# Patient Record
Sex: Male | Born: 1985 | Race: White | Hispanic: No | Marital: Single | Smoking: Current every day smoker
Health system: Southern US, Community
[De-identification: ages and names within clinical notes are randomized; demographics above are authoritative.]

## PROBLEM LIST (undated history)

## (undated) DIAGNOSIS — I10 Essential (primary) hypertension: Secondary | ICD-10-CM

## (undated) HISTORY — DX: Essential (primary) hypertension: I10

---

## 2016-02-05 ENCOUNTER — Ambulatory Visit (INDEPENDENT_AMBULATORY_CARE_PROVIDER_SITE_OTHER): Payer: 59 | Admitting: Family Medicine

## 2016-02-05 VITALS — BP 122/84 | HR 91 | Temp 98.5°F | Resp 16 | Ht 72.0 in | Wt 238.4 lb

## 2016-02-05 DIAGNOSIS — I10 Essential (primary) hypertension: Secondary | ICD-10-CM

## 2016-02-05 DIAGNOSIS — Z23 Encounter for immunization: Secondary | ICD-10-CM | POA: Diagnosis not present

## 2016-02-05 MED ORDER — LISINOPRIL-HYDROCHLOROTHIAZIDE 20-12.5 MG PO TABS: 1.0000 | ORAL_TABLET | Freq: Every day | ORAL | 1 refills | Status: AC

## 2016-02-05 MED ORDER — POTASSIUM CHLORIDE ER 10 MEQ PO TBCR: 10.0000 meq | EXTENDED_RELEASE_TABLET | Freq: Every day | ORAL | 1 refills | Status: AC

## 2016-02-05 NOTE — Progress Notes (Signed)
Subjective:    Patient ID: Keith Maddox, male    DOB: 05-11-1986, 30 y.o.   MRN: [id number] By signing my name below, I, Javier Docker, attest that this documentation has been prepared under the direction and in the presence of Norberto Sorenson, MD. Electronically Signed: Javier Docker, ER Scribe. 02/05/2016. 11:09 AM.  Chief Complaint  Patient presents with  . Medication Refill    Lisinopril- HCTZ 20-12.5 and Potassium     HPI HPI Comments: Keith Maddox is a 30 y.o. male who presents to [facility] requesting a refill of his BP meds - lisinopril-hctz 20-12.5 with K 10. Has been on for years with good result.  He lives in [place] and follows up reg with his PCP there. Had labs done a few mos prior which he reports were normal.  He is in [place] for a temporary job and plans to return to [facility] in sev mos. No causes of secondary HTN identified, HTN does run in his family fairly strong.  Past Medical History:  Diagnosis Date  . Hypertension    No Known Allergies  No current outpatient prescriptions on file prior to visit.   No current facility-administered medications on file prior to visit.     Review of Systems  Constitutional: Negative for chills and fever.  Eyes: Negative for visual disturbance.  Respiratory: Negative for shortness of breath.   Cardiovascular: Negative for chest pain and leg swelling.  Neurological: Negative for dizziness, syncope, facial asymmetry, weakness, light-headedness and headaches.       Objective:   Physical Exam  Constitutional: He is oriented to person, place, and time. He appears well-developed and well-nourished. No distress.  HENT:  Head: Normocephalic and atraumatic.  Eyes: Pupils are equal, round, and reactive to light.  Neck: Neck supple.  Cardiovascular: Normal rate.   Pulmonary/Chest: Effort normal. No respiratory distress.  Musculoskeletal: Normal range of motion.  Neurological: He is alert and oriented to person, place, and time. Coordination  normal.  Skin: Skin is warm and dry. He is not diaphoretic.  Psychiatric: He has a normal mood and affect. His behavior is normal.  Nursing note and vitals reviewed.   BP 122/84 (BP Location: Right Arm, Patient Position: Sitting, Cuff Size: Large)   Pulse 91   Temp 98.5 F (36.9 C) (Oral)   Resp 16   Ht 6' (1.829 m)   Wt 238 lb 6.4 oz (108.1 kg)   SpO2 99%   BMI 32.33 kg/m      Assessment & Plan:   1. Essential hypertension   2. Need for prophylactic vaccination and inoculation against influenza   Meds refilled. Pt is in [place] for just a few mos for work. He reports he had normal lab work sev mos ago with his PCP in [facility] and will f/u again with PCP when he returns in to [facility] in sev mos.  Orders Placed This Encounter  Procedures  . Flu Vaccine QUAD 36+ mos IM    Meds ordered this encounter  Medications  . DISCONTD: lisinopril-hydrochlorothiazide (PRINZIDE,ZESTORETIC) 20-12.5 MG tablet    Sig: Take 1 tablet by mouth daily.  Marland Kitchen DISCONTD: potassium chloride (K-DUR) 10 MEQ tablet    Sig: Take 10 mEq by mouth daily.  Marland Kitchen lisinopril-hydrochlorothiazide (PRINZIDE,ZESTORETIC) 20-12.5 MG tablet    Sig: Take 1 tablet by mouth daily.    Dispense:  90 tablet    Refill:  1  . potassium chloride (K-DUR) 10 MEQ tablet    Sig: Take 1 tablet (  10 mEq total) by mouth daily.    Dispense:  90 tablet    Refill:  1    I personally performed the services described in this documentation, which was scribed in my presence. The recorded information has been reviewed and considered, and addended by me as needed.   Norberto SorensonEva Kess Mcilwain, M.D.  [facility]  [place] [address] [place], [place] [address] [phone] phone [phone] fax  02/20/16 12:46 AM

## 2016-02-05 NOTE — Patient Instructions (Addendum)
   IF you received an x-ray today, you will receive an invoice from [facility]. Please contact [facility] at [phone] with questions or concerns regarding your invoice.   IF you received labwork today, you will receive an invoice from [facility]. Please contact [facility] at [phone] with questions or concerns regarding your invoice.   Our billing staff will not be able to assist you with questions regarding bills from these companies.  You will be contacted with the lab results as soon as they are available. The fastest way to get your results is to activate your My Chart account. Instructions are located on the last page of this paperwork. If you have not heard from us regarding the results in 2 weeks, please contact this office.    Hypertension Hypertension, commonly called high blood pressure, is when the force of blood pumping through your arteries is too strong. Your arteries are the blood vessels that carry blood from your heart throughout your body. A blood pressure reading consists of a higher number over a lower number, such as 110/72. The higher number (systolic) is the pressure inside your arteries when your heart pumps. The lower number (diastolic) is the pressure inside your arteries when your heart relaxes. Ideally you want your blood pressure below 120/80. Hypertension forces your heart to work harder to pump blood. Your arteries may become narrow or stiff. Having untreated or uncontrolled hypertension can cause heart attack, stroke, kidney disease, and other problems. RISK FACTORS Some risk factors for high blood pressure are controllable. Others are not.  Risk factors you cannot control include:   Race. You may be at higher risk if you are African American.  Age. Risk increases with age.  Gender. Men are at higher risk than women before age 45 years. After age 65, women are at higher risk than men. Risk factors you can  control include:  Not getting enough exercise or physical activity.  Being overweight.  Getting too much fat, sugar, calories, or salt in your diet.  Drinking too much alcohol. SIGNS AND SYMPTOMS Hypertension does not usually cause signs or symptoms. Extremely high blood pressure (hypertensive crisis) may cause headache, anxiety, shortness of breath, and nosebleed. DIAGNOSIS To check if you have hypertension, your health care provider will measure your blood pressure while you are seated, with your arm held at the level of your heart. It should be measured at least twice using the same arm. Certain conditions can cause a difference in blood pressure between your right and left arms. A blood pressure reading that is higher than normal on one occasion does not mean that you need treatment. If it is not clear whether you have high blood pressure, you may be asked to return on a different day to have your blood pressure checked again. Or, you may be asked to monitor your blood pressure at home for 1 or more weeks. TREATMENT Treating high blood pressure includes making lifestyle changes and possibly taking medicine. Living a healthy lifestyle can help lower high blood pressure. You may need to change some of your habits. Lifestyle changes may include:  Following the DASH diet. This diet is high in fruits, vegetables, and whole grains. It is low in salt, red meat, and added sugars.  Keep your sodium intake below 2,300 mg per day.  Getting at least 30-45 minutes of aerobic exercise at least 4 times per week.  Losing weight if necessary.  Not smoking.  Limiting alcoholic beverages.    Learning ways to reduce stress. Your health care provider may prescribe medicine if lifestyle changes are not enough to get your blood pressure under control, and if one of the following is true:  You are 18-59 years of age and your systolic blood pressure is above 140.  You are 60 years of age or older, and  your systolic blood pressure is above 150.  Your diastolic blood pressure is above 90.  You have diabetes, and your systolic blood pressure is over 140 or your diastolic blood pressure is over 90.  You have kidney disease and your blood pressure is above 140/90.  You have heart disease and your blood pressure is above 140/90. Your personal target blood pressure may vary depending on your medical conditions, your age, and other factors. HOME CARE INSTRUCTIONS  Have your blood pressure rechecked as directed by your health care provider.   Take medicines only as directed by your health care provider. Follow the directions carefully. Blood pressure medicines must be taken as prescribed. The medicine does not work as well when you skip doses. Skipping doses also puts you at risk for problems.  Do not smoke.   Monitor your blood pressure at home as directed by your health care provider. SEEK MEDICAL CARE IF:   You think you are having a reaction to medicines taken.  You have recurrent headaches or feel dizzy.  You have swelling in your ankles.  You have trouble with your vision. SEEK IMMEDIATE MEDICAL CARE IF:  You develop a severe headache or confusion.  You have unusual weakness, numbness, or feel faint.  You have severe chest or abdominal pain.  You vomit repeatedly.  You have trouble breathing. MAKE SURE YOU:   Understand these instructions.  Will watch your condition.  Will get help right away if you are not doing well or get worse.   This information is not intended to replace advice given to you by your health care provider. Make sure you discuss any questions you have with your health care provider.   Document Released: 05/08/2005 Document Revised: 09/22/2014 Document Reviewed: 02/28/2013 Elsevier Interactive Patient Education 2016 Elsevier Inc.  Managing Your High Blood Pressure Blood pressure is a measurement of how forceful your blood is pressing against  the walls of the arteries. Arteries are muscular tubes within the circulatory system. Blood pressure does not stay the same. Blood pressure rises when you are active, excited, or nervous; and it lowers during sleep and relaxation. If the numbers measuring your blood pressure stay above normal most of the time, you are at risk for health problems. High blood pressure (hypertension) is a long-term (chronic) condition in which blood pressure is elevated. A blood pressure reading is recorded as two numbers, such as 120 over 80 (or 120/80). The first, higher number is called the systolic pressure. It is a measure of the pressure in your arteries as the heart beats. The second, lower number is called the diastolic pressure. It is a measure of the pressure in your arteries as the heart relaxes between beats.  Keeping your blood pressure in a normal range is important to your overall health and prevention of health problems, such as heart disease and stroke. When your blood pressure is uncontrolled, your heart has to work harder than normal. High blood pressure is a very common condition in adults because blood pressure tends to rise with age. Men and women are equally likely to have hypertension but at different times in life. Before age   45, men are more likely to have hypertension. After 28 years of age, women are more likely to have it. Hypertension is especially common in African Americans. This condition often has no signs or symptoms. The cause of the condition is usually not known. Your caregiver can help you come up with a plan to keep your blood pressure in a normal, healthy range. BLOOD PRESSURE STAGES Blood pressure is classified into four stages: normal, prehypertension, stage 1, and stage 2. Your blood pressure reading will be used to determine what type of treatment, if any, is necessary. Appropriate treatment options are tied to these four stages:  Normal  Systolic pressure (mm Hg): below  120.  Diastolic pressure (mm Hg): below 80. Prehypertension  Systolic pressure (mm Hg): 120 to 139.  Diastolic pressure (mm Hg): 80 to 89. Stage1  Systolic pressure (mm Hg): 140 to 159.  Diastolic pressure (mm Hg): 90 to 99. Stage2  Systolic pressure (mm Hg): 160 or above.  Diastolic pressure (mm Hg): 100 or above. RISKS RELATED TO HIGH BLOOD PRESSURE Managing your blood pressure is an important responsibility. Uncontrolled high blood pressure can lead to:  A heart attack.  A stroke.  A weakened blood vessel (aneurysm).  Heart failure.  Kidney damage.  Eye damage.  Metabolic syndrome.  Memory and concentration problems. HOW TO MANAGE YOUR BLOOD PRESSURE Blood pressure can be managed effectively with lifestyle changes and medicines (if needed). Your caregiver will help you come up with a plan to bring your blood pressure within a normal range. Your plan should include the following: Education  Read all information provided by your caregivers about how to control blood pressure.  Educate yourself on the latest guidelines and treatment recommendations. New research is always being done to further define the risks and treatments for high blood pressure. Lifestylechanges  Control your weight.  Avoid smoking.  Stay physically active.  Reduce the amount of salt in your diet.  Reduce stress.  Control any chronic conditions, such as high cholesterol or diabetes.  Reduce your alcohol intake. Medicines  Several medicines (antihypertensive medicines) are available, if needed, to bring blood pressure within a normal range. Communication  Review all the medicines you take with your caregiver because there may be side effects or interactions.  Talk with your caregiver about your diet, exercise habits, and other lifestyle factors that may be contributing to high blood pressure.  See your caregiver regularly. Your caregiver can help you create and adjust your plan  for managing high blood pressure. RECOMMENDATIONS FOR TREATMENT AND FOLLOW-UP  The following recommendations are based on current guidelines for managing high blood pressure in nonpregnant adults. Use these recommendations to identify the proper follow-up period or treatment option based on your blood pressure reading. You can discuss these options with your caregiver.  Systolic pressure of 120 to 139 or diastolic pressure of 80 to 89: Follow up with your caregiver as directed.  Systolic pressure of 140 to 160 or diastolic pressure of 90 to 100: Follow up with your caregiver within 2 months.  Systolic pressure above 160 or diastolic pressure above 100: Follow up with your caregiver within 1 month.  Systolic pressure above 180 or diastolic pressure above 110: Consider antihypertensive therapy; follow up with your caregiver within 1 week.  Systolic pressure above 200 or diastolic pressure above 120: Begin antihypertensive therapy; follow up with your caregiver within 1 week.   This information is not intended to replace advice given to you by your health care   provider. Make sure you discuss any questions you have with your health care provider.   Document Released: 01/31/2012 Document Reviewed: 01/31/2012 Elsevier Interactive Patient Education 2016 Elsevier Inc.  

## 2016-02-15 ENCOUNTER — Ambulatory Visit (HOSPITAL_COMMUNITY)
Admission: RE | Admit: 2016-02-15 | Discharge: 2016-02-15 | Disposition: A | Discharge status: Home / Self Care | Payer: Medicaid Other | Source: Ambulatory Visit | Attending: Physician Assistant | Admitting: Physician Assistant

## 2016-02-15 ENCOUNTER — Ambulatory Visit (INDEPENDENT_AMBULATORY_CARE_PROVIDER_SITE_OTHER): Payer: 59

## 2016-02-15 ENCOUNTER — Ambulatory Visit (INDEPENDENT_AMBULATORY_CARE_PROVIDER_SITE_OTHER): Payer: 59 | Admitting: Physician Assistant

## 2016-02-15 ENCOUNTER — Encounter (HOSPITAL_COMMUNITY): Payer: Self-pay

## 2016-02-15 VITALS — BP 108/68 | HR 88 | Temp 98.6°F | Resp 18 | Ht 72.0 in | Wt 233.0 lb

## 2016-02-15 DIAGNOSIS — R609 Edema, unspecified: Secondary | ICD-10-CM | POA: Diagnosis not present

## 2016-02-15 DIAGNOSIS — R935 Abnormal findings on diagnostic imaging of other abdominal regions, including retroperitoneum: Secondary | ICD-10-CM | POA: Diagnosis not present

## 2016-02-15 DIAGNOSIS — R630 Anorexia: Secondary | ICD-10-CM

## 2016-02-15 DIAGNOSIS — R1032 Left lower quadrant pain: Secondary | ICD-10-CM

## 2016-02-15 DIAGNOSIS — G43A0 Cyclical vomiting, not intractable: Secondary | ICD-10-CM | POA: Diagnosis not present

## 2016-02-15 LAB — COMPLETE METABOLIC PANEL WITH GFR
ALT: 18 U/L (ref 9–46)
AST: 12 U/L (ref 10–40)
Albumin: 4.2 g/dL (ref 3.6–5.1)
Alkaline Phosphatase: 38 U/L — ABNORMAL LOW (ref 40–115)
BILIRUBIN TOTAL: 0.3 mg/dL (ref 0.2–1.2)
BUN: 8 mg/dL (ref 7–25)
CHLORIDE: 103 mmol/L (ref 98–110)
CO2: 22 mmol/L (ref 20–31)
CREATININE: 1.01 mg/dL (ref 0.60–1.35)
Calcium: 8.9 mg/dL (ref 8.6–10.3)
GFR, Est Non African American: >89 mL/min (ref >=60)
GLUCOSE: 94 mg/dL (ref 65–99)
Potassium: 3.4 mmol/L — ABNORMAL LOW (ref 3.5–5.3)
SODIUM: 137 mmol/L (ref 135–146)
Total Protein: 6.9 g/dL (ref 6.1–8.1)

## 2016-02-15 LAB — POCT CBC
Granulocyte percent: 79.5 %G (ref 37–80)
HCT, POC: 42.9 % — AB (ref 43.5–53.7)
HEMOGLOBIN: 15.4 g/dL (ref 14.1–18.1)
LYMPH, POC: 1.3 (ref 0.6–3.4)
MCH, POC: 31.8 pg — AB (ref 27–31.2)
MCHC: 35.8 g/dL — AB (ref 31.8–35.4)
MCV: 88.7 fL (ref 80–97)
MID (cbc): 0.5 (ref 0–0.9)
MPV: 8.6 fL (ref 0–99.8)
PLATELET COUNT, POC: 209 10*3/uL (ref 142–424)
POC GRANULOCYTE: 7.1 — AB (ref 2–6.9)
POC LYMPH %: 14.4 % (ref 10–50)
POC MID %: 6.1 %M (ref 0–12)
RBC: 4.84 M/uL (ref 4.69–6.13)
RDW, POC: 13.5 %
WBC: 8.9 10*3/uL (ref 4.6–10.2)

## 2016-02-15 LAB — POCT URINALYSIS DIP (MANUAL ENTRY)
BILIRUBIN UA: NEGATIVE
Bilirubin, UA: NEGATIVE
Blood, UA: NEGATIVE
Glucose, UA: NEGATIVE
LEUKOCYTES UA: NEGATIVE
NITRITE UA: NEGATIVE
PH UA: 7
PROTEIN UA: NEGATIVE
Spec Grav, UA: 1.015
UROBILINOGEN UA: 0.2

## 2016-02-15 LAB — LIPASE: Lipase: 10 U/L (ref 7–60)

## 2016-02-15 LAB — POCT SEDIMENTATION RATE: POCT SED RATE: 31 mm/h — AB (ref 0–22)

## 2016-02-15 LAB — PSA: PSA: 0.3 ng/mL (ref <=4.0)

## 2016-02-15 MED ORDER — ONDANSETRON 4 MG PO TBDP
4.0000 mg | ORAL_TABLET | Freq: Once | ORAL | Status: AC
  Administered 2016-02-15: 4 mg via ORAL

## 2016-02-15 MED ORDER — IOPAMIDOL (ISOVUE-300) INJECTION 61%
INTRAVENOUS | Status: AC
Start: 2016-02-15 — End: 2016-02-15
  Administered 2016-02-15: 100 mL
  Filled 2016-02-15: qty 30

## 2016-02-15 NOTE — Addendum Note (Signed)
Addended by: Nonnie DoneSMITH, JILL D on: 02/15/2016 04:20 PM   Modules accepted: Orders

## 2016-02-15 NOTE — Progress Notes (Signed)
02/15/2016 4:14 PM   DOB: 1985/10/04 / MRN: [id number]  SUBJECTIVE:  Keith Maddox is a 30 y.o. male presenting for right lower abdominal quadrant pain that started yesterday. He associates emesis and nausea with the pain.  States this started all of the sudden.  He has hand one non bloody bowel movement since.  He does not drink.  He does smoke at 1 pack/day for the last 8 years.  No fever.  Works as a Psychologist, occupationalwelder. Can not eat or drink due to the nausea.   He has No Known Allergies.   He  has a past medical history of Hypertension.    He  reports that he has been smoking.  He has never used smokeless tobacco. He  has no sexual activity history on file. The patient  has no past surgical history on file.  His family history is not on file.  Review of Systems  Constitutional: Negative for chills and fever.  Respiratory: Negative for cough.   Cardiovascular: Negative for chest pain.  Gastrointestinal: Positive for abdominal pain, nausea and vomiting. Negative for blood in stool, constipation, diarrhea, heartburn and melena.  Genitourinary: Negative for dysuria.  Skin: Negative for itching and rash.  Neurological: Negative for dizziness.    The problem list and medications were reviewed and updated by myself where necessary and exist elsewhere in the encounter.   OBJECTIVE:  BP 108/68 (BP Location: Right Arm, Patient Position: Sitting, Cuff Size: Large)   Pulse 88   Temp 98.6 F (37 C) (Oral)   Resp 18   Ht 6' (1.829 m)   Wt 233 lb (105.7 kg)   SpO2 98%   BMI 31.60 kg/m   Physical Exam  Constitutional: He is oriented to person, place, and time. Vital signs are normal. He appears ill. No distress.  Cardiovascular: Normal rate and regular rhythm.   Pulmonary/Chest: Effort normal and breath sounds normal.  Abdominal: Soft. Bowel sounds are normal. There is tenderness (RLQ, LLQ, positive Rosving, negative obturator. ).  Musculoskeletal: Normal range of motion.  Neurological: He is  alert and oriented to person, place, and time. He displays normal reflexes. No cranial nerve deficit. He exhibits normal muscle tone. Coordination normal.  Skin: Skin is warm and dry.  Psychiatric: He has a normal mood and affect.    Results for orders placed or performed in visit on 02/15/16 (from the past 72 hour(s))  POCT CBC     Status: Abnormal   Collection Time: 02/15/16  3:11 PM  Result Value Ref Range   WBC 8.9 4.6 - 10.2 K/uL   Lymph, poc 1.3 0.6 - 3.4   POC LYMPH PERCENT 14.4 10 - 50 %L   MID (cbc) 0.5 0 - 0.9   POC MID % 6.1 0 - 12 %M   POC Granulocyte 7.1 (A) 2 - 6.9   Granulocyte percent 79.5 37 - 80 %G   RBC 4.84 4.69 - 6.13 M/uL   Hemoglobin 15.4 14.1 - 18.1 g/dL   HCT, POC 40.942.9 (A) 81.143.5 - 53.7 %   MCV 88.7 80 - 97 fL   MCH, POC 31.8 (A) 27 - 31.2 pg   MCHC 35.8 (A) 31.8 - 35.4 g/dL   RDW, POC 91.413.5 %   Platelet Count, POC 209 142 - 424 K/uL   MPV 8.6 0 - 99.8 fL  POCT urinalysis dipstick     Status: None   Collection Time: 02/15/16  3:56 PM  Result Value Ref Range   Color,  UA yellow yellow   Clarity, UA clear clear   Glucose, UA negative negative   Bilirubin, UA negative negative   Ketones, POC UA negative negative   Spec Grav, UA 1.015    Blood, UA negative negative   pH, UA 7.0    Protein Ur, POC negative negative   Urobilinogen, UA 0.2    Nitrite, UA Negative Negative   Leukocytes, UA Negative Negative  POCT SEDIMENTATION RATE     Status: Abnormal   Collection Time: 02/15/16  4:03 PM  Result Value Ref Range   POCT SED RATE 31 (A) 0 - 22 mm/hr    Dg Abd 2 Views  Result Date: 02/15/2016 CLINICAL DATA:  Left lower quadrant abdominal pain and tenderness with nausea and vomiting. EXAM: ABDOMEN - 2 VIEW COMPARISON:  None. FINDINGS: The bowel gas pattern is nonobstructive. Prominent stool burden throughout the colon is noted. No free intraperitoneal air. No abnormal abdominal calcification or bony abnormality. IMPRESSION: No acute abnormality. Prominent  stool burden throughout the colon. Electronically Signed   By: Drusilla Kanner M.D.   On: 02/15/2016 15:02    ASSESSMENT AND PLAN  Olin was seen today for abdominal pain, nausea, emesis and generalized body aches.  Diagnoses and all orders for this visit:  LLQ abdominal pain: His work up is reassuring however I have a strong clinical suspicion for appendicitis.  Will get a CT of a the abdomen and pelvis tonight.   -     POCT CBC -     COMPLETE METABOLIC PANEL WITH GFR -     Lipase -     POCT SEDIMENTATION RATE -     DG Abd 2 Views; Future -     PSA -     CT Abdomen Pelvis W Contrast; Future  Anorexia -     POCT urinalysis dipstick  Cyclical vomiting with nausea, intractability of vomiting not specified -     ondansetron (ZOFRAN-ODT) disintegrating tablet 4 mg; Take 1 tablet (4 mg total) by mouth once.    The patient is advised to call or return to clinic if he does not see an improvement in symptoms, or to seek the care of the closest emergency department if he worsens with the above plan.   Deliah Boston, MHS, PA-C [facility] 02/15/2016 4:14 PM

## 2016-02-15 NOTE — Patient Instructions (Signed)
     IF you received an x-ray today, you will receive an invoice from [facility]. Please contact [facility] at [phone] with questions or concerns regarding your invoice.   IF you received labwork today, you will receive an invoice from [facility]. Please contact [facility] at [phone] with questions or concerns regarding your invoice.   Our billing staff will not be able to assist you with questions regarding bills from these companies.  You will be contacted with the lab results as soon as they are available. The fastest way to get your results is to activate your My Chart account. Instructions are located on the last page of this paperwork. If you have not heard from us regarding the results in 2 weeks, please contact this office.      

## 2016-02-16 ENCOUNTER — Encounter: Payer: Self-pay | Admitting: *Deleted

## 2016-02-16 ENCOUNTER — Telehealth: Payer: Self-pay

## 2016-02-16 ENCOUNTER — Ambulatory Visit: Payer: 59

## 2016-02-16 NOTE — Progress Notes (Signed)
Labs overall unremarkable.  Given his CT findings and the fact that these symptoms tend to wax and wane he may have inflammatory bowel disease. I am referring him to ID. Deliah BostonMichael Jnyah Brazee, MS, PA-C 1:11 PM, 02/16/2016

## 2016-02-16 NOTE — Telephone Encounter (Signed)
Auth for CT preformed 9/26 is pending clinical information. Please call and supply necessary info to complete the claim.  Case *[id number]  [phone]

## 2016-02-16 NOTE — Telephone Encounter (Signed)
Please f/u

## 2016-02-16 NOTE — Telephone Encounter (Signed)
He saw Keith Maddox on 02/15/16 for LLQ abdominal pain +2 more.  We do not accept his insurance-[employer]-Community Plan-Medicaid. Keith Maddox would like a referral to see a specialist for his stomach issues. He is in extreme pain concerning his stomach. The Tramadol he was prescribed is not working, he would like another pain med. Please advise at [phone]

## 2016-02-17 ENCOUNTER — Telehealth: Payer: Self-pay

## 2016-02-17 ENCOUNTER — Other Ambulatory Visit: Payer: Self-pay | Admitting: Physician Assistant

## 2016-02-17 ENCOUNTER — Telehealth: Payer: Self-pay | Admitting: Emergency Medicine

## 2016-02-17 DIAGNOSIS — K639 Disease of intestine, unspecified: Secondary | ICD-10-CM

## 2016-02-17 MED ORDER — HYDROCODONE-ACETAMINOPHEN 5-325 MG PO TABS: 1.0000 | ORAL_TABLET | Freq: Four times a day (QID) | ORAL | 0 refills | Status: AC | PRN

## 2016-02-17 NOTE — Telephone Encounter (Signed)
Pt is coming in to get his pain meds script but is hoping that michael could get a referral to a gastro   Best number [phone]

## 2016-02-17 NOTE — Telephone Encounter (Signed)
I do not see a referral on his chart other than for the CT scan. Please resubmit so that I can get him sent out to a specialist. Thank you!

## 2016-02-17 NOTE — Telephone Encounter (Signed)
This is in!

## 2016-02-17 NOTE — Telephone Encounter (Signed)
Referal to specialist is in.  I am starting his on Norco 5-325 but I can not keep him on this for more than 5 days total.  After we will have to go back to tramadol.  Deliah BostonMichael Jovoni Borkenhagen, MS, PA-C 8:25 AM, 02/17/2016

## 2016-02-17 NOTE — Telephone Encounter (Signed)
Left message to return call regarding pain medication   Hydrocodone ordered and placed at front for pick up

## 2016-02-18 ENCOUNTER — Telehealth: Payer: Self-pay | Admitting: Emergency Medicine

## 2016-02-18 NOTE — Telephone Encounter (Signed)
Left message that GI referral was placed yesterstay

## 2016-02-28 NOTE — Telephone Encounter (Signed)
The scan was preformed on 9/26. Rerouting to clinical to have someone call the insurance company in attempts to gain retro-certification.

## 2016-02-28 NOTE — Telephone Encounter (Signed)
Hi Jill. Can we have someone on the RN side call. Please see my note in which I clearly state concern for appendicitis in the assess plan.  His workup was okay in the office but his exam and overall appearance showed he needed a scan.

## 2016-02-28 NOTE — Telephone Encounter (Signed)
Contacted pt because it looks like he had an appt w/GI we referred to on 10/2. Pt stated that he did go to the GI and it was not appendicitis (he no longer needs the scan). He had a colonoscopy on Thurs and they are 95% sure that he has Crohn's disease, but he has further f/up or testing to verify Dx. Pt wanted to thank Casimiro NeedleMichael for all of his help.  Referrals, you can cancel the CT scan referral. Thanks!

## 2016-02-28 NOTE — Telephone Encounter (Signed)
CT scan has already taken place and was approved prior auth as we did it stat.    Sound like ins company is trying to deny after it was approved despite the concern for acute appendicitis expressed in my note.  Regardless I am glad he received the scan as it sounds like it lead to a meaningful referral and important diagnosis. Deliah BostonMichael Debar Plate, MS, PA-C 7:36 PM, 02/28/2016

## 2016-02-28 NOTE — Telephone Encounter (Signed)
This message was sent to clinical. Wanted to forward it to you as well since you were the ordering provider. If you have time to call, it would be greatly appreciated as the ins is currently denying coverage. Thank you!

## 2016-03-02 ENCOUNTER — Telehealth: Payer: Self-pay

## 2016-03-02 NOTE — Telephone Encounter (Signed)
Pt was under the impression that there should be a dr's note printed for him. Could not find it in the pick up drawer at front desk or attached to his chart or avs anywhere.   Once completed, please mail out to   Attn Roselie SkinnerNicholas Grill [facility] [address] [place] [id number]  Thank you!

## 2016-03-03 NOTE — Telephone Encounter (Signed)
Note printed and mailed. 

## 2016-03-24 ENCOUNTER — Ambulatory Visit (INDEPENDENT_AMBULATORY_CARE_PROVIDER_SITE_OTHER): Payer: 59 | Admitting: Physician Assistant

## 2016-03-24 VITALS — BP 128/80 | HR 97 | Temp 98.2°F | Resp 16 | Ht 72.0 in | Wt 231.0 lb

## 2016-03-24 DIAGNOSIS — K120 Recurrent oral aphthae: Secondary | ICD-10-CM

## 2016-03-24 DIAGNOSIS — R42 Dizziness and giddiness: Secondary | ICD-10-CM

## 2016-03-24 DIAGNOSIS — R110 Nausea: Secondary | ICD-10-CM

## 2016-03-24 DIAGNOSIS — Z8719 Personal history of other diseases of the digestive system: Secondary | ICD-10-CM | POA: Diagnosis not present

## 2016-03-24 LAB — COMPLETE METABOLIC PANEL WITH GFR
ALT: 21 U/L (ref 9–46)
Albumin: 4.8 g/dL (ref 3.6–5.1)
Alkaline Phosphatase: 42 U/L (ref 40–115)
CO2: 23 mmol/L (ref 20–31)
Calcium: 10 mg/dL (ref 8.6–10.3)
Creat: 0.95 mg/dL (ref 0.60–1.35)
GFR, Est African American: >89 mL/min (ref >=60)
Total Bilirubin: 0.5 mg/dL (ref 0.2–1.2)
Total Protein: 7.9 g/dL (ref 6.1–8.1)

## 2016-03-24 LAB — POCT CBC
Granulocyte percent: 83.9 %G — AB (ref 37–80)
HCT, POC: 46.4 % (ref 43.5–53.7)
Hemoglobin: 16.3 g/dL (ref 14.1–18.1)
Lymph, poc: 2.7 (ref 0.6–3.4)
MCH, POC: 31.8 pg — AB (ref 27–31.2)
MCHC: 35.1 g/dL (ref 31.8–35.4)
MCV: 90.5 fL (ref 80–97)
MID (cbc): 0.2 (ref 0–0.9)
MPV: 7.7 fL (ref 0–99.8)
POC Granulocyte: 15.1 — AB (ref 2–6.9)
POC LYMPH PERCENT: 15.2 %L (ref 10–50)
POC MID %: 0.9 % (ref 0–12)
Platelet Count, POC: 306 10*3/uL (ref 142–424)
RBC: 5.12 M/uL (ref 4.69–6.13)
RDW, POC: 14.5 %
WBC: 18 10*3/uL — AB (ref 4.6–10.2)

## 2016-03-24 LAB — COMPLETE METABOLIC PANEL WITHOUT GFR
AST: 14 U/L (ref 10–40)
BUN: 17 mg/dL (ref 7–25)
Chloride: 96 mmol/L — ABNORMAL LOW (ref 98–110)
GFR, Est Non African American: >89 mL/min (ref >=60)
Glucose, Bld: 121 mg/dL — ABNORMAL HIGH (ref 65–99)
Potassium: 4 mmol/L (ref 3.5–5.3)
Sodium: 134 mmol/L — ABNORMAL LOW (ref 135–146)

## 2016-03-24 LAB — TSH: TSH: 0.77 mIU/L (ref 0.40–4.50)

## 2016-03-24 NOTE — Progress Notes (Signed)
Keith Maddox  MRN: [id number] DOB: [birth year]  PCP: No PCP Per Patient  Subjective:  Pt is a 30 year old male, history of hypertension and recent diagnosis of Chron's disease, presents to clinic for dizziness x several days.   Five days ago he felt fatigued and left work early. The following day he reported for work, however was brought to tears because felt so badly. Complains of light-headedness, dizziness, "feeling weird" and "pukey". Notes associated chills followed by episodes of sweating.    Denies syncope, however felt like he could pass out. Denies blood in stool, fevers, vomiting, diarrhea, abnormal stools, abdominal pain.  Reports congestion last week.  Is currently on steroid treatment for recent diagnosis of Chron's disease. He has a follow-up appointment in two weeks with GI.  Takes Norco for abdominal pain.   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 Works at [employer]. Has not gone to work the past two days   Was here two months ago for abdominal pain. Abdominal CT 9/26 revealed abnormal wall edema involving terminal ileum and ascending colon. Findings are compatible with inflammatory or infectious enteritis/colitis.  [facility] - [facility] specialist.   Review of Systems  Constitutional: Positive for activity change, chills and fever. Negative for appetite change.  HENT: Positive for congestion and mouth sores.   Respiratory: Negative for cough, chest tightness, shortness of breath and wheezing.   Cardiovascular: Negative for chest pain and palpitations.  Gastrointestinal: Positive for nausea. Negative for abdominal pain, blood in stool, diarrhea and vomiting.  Neurological: Positive for dizziness and light-headedness. Negative for syncope and headaches.    There are no active problems to display for this patient.   Current Outpatient Prescriptions on File Prior to Visit  Medication Sig Dispense Refill  . HYDROcodone-acetaminophen (NORCO) 5-325  MG tablet Take 1 tablet by mouth every 6 (six) hours as needed for moderate pain. 25 tablet 0  . lisinopril-hydrochlorothiazide (PRINZIDE,ZESTORETIC) 20-12.5 MG tablet Take 1 tablet by mouth daily. 90 tablet 1  . potassium chloride (K-DUR) 10 MEQ tablet Take 1 tablet (10 mEq total) by mouth daily. 90 tablet 1   No current facility-administered medications on file prior to visit.     No Known Allergies  Objective:  BP 128/80 (BP Location: Right Arm, Patient Position: Sitting, Cuff Size: Normal)   Pulse 97   Temp 98.2 F (36.8 C) (Oral)   Resp 16   Ht 6' (1.829 m)   Wt 231 lb (104.8 kg)   SpO2 100%   BMI 31.33 kg/m   Physical Exam  Constitutional: He is oriented to person, place, and time and well-developed, well-nourished, and in no distress. No distress.  HENT:  Right Ear: Tympanic membrane normal.  Left Ear: Tympanic membrane normal.  Mouth/Throat: Oropharynx is clear and moist and mucous membranes are normal.    Aphthous ulcer noted right lateral mid tongue. No other ulcerations present.   Eyes: Conjunctivae are normal. Pupils are equal, round, and reactive to light.  Cardiovascular: Normal rate, regular rhythm, normal heart sounds and intact distal pulses.   Pulmonary/Chest: Effort normal and breath sounds normal. No respiratory distress. He has no wheezes. He has no rales.  Abdominal: Bowel sounds are normal. He exhibits no distension. There is tenderness (minor TTP LLQ). There is no guarding.  Neurological: He is alert and oriented to person, place, and time. GCS score is 15.  Skin: He is diaphoretic.  Psychiatric: Mood, memory, affect and judgment normal.  Vitals reviewed.  Assessment and Plan :  1. Dizziness 2. Light headed 3. Nausea without vomiting 4. Ulcer aphthous oral 5. History of colitis - POCT CBC - Orthostatic vital signs - COMPLETE METABOLIC PANEL WITH GFR - TSH - Possible Chron's flare vs. Viral infection. Will contact with lab results. Encouraged  patient to continue with current treatment of steroids and keep appointment with [facility] in two weeks. Instructed patient to call or RTC in 3-4 days if no improvement/worsening symptoms.   Keith CollieWhitney Jurell Basista, PA-C  [facility] 03/24/2016 9:59 AM

## 2016-03-24 NOTE — Patient Instructions (Addendum)
I believe your symptoms are due to chron's flare. These are very typical of Chron's disease, including your mouth sore. Please call me on Monday here at the office if you are still not feeling better.  Please be sure to drink plenty of fluids and rest as needed.   Thank you for coming in today. I hope you feel we met your needs.  Feel free to call [facility] if you have any questions or further requests.  Please consider signing up for MyChart if you do not already have it, as this is a great way to communicate with me.  Best,  Whitney McVey, PA-C   IF you received an x-ray today, you will receive an invoice from [facility]. Please contact [facility] at [phone] with questions or concerns regarding your invoice.   IF you received labwork today, you will receive an invoice from [employer]. Please contact [facility] at [phone] with questions or concerns regarding your invoice.   Our billing staff will not be able to assist you with questions regarding bills from these companies.  You will be contacted with the lab results as soon as they are available. The fastest way to get your results is to activate your My Chart account. Instructions are located on the last page of this paperwork. If you have not heard from [place] regarding the results in 2 weeks, please contact this office.

## 2016-05-26 ENCOUNTER — Ambulatory Visit: Payer: 59

## 2016-08-09 ENCOUNTER — Telehealth: Payer: Self-pay

## 2016-08-09 NOTE — Telephone Encounter (Signed)
Form from [employer] to acknowledge pt's meds.  Form in nurses box - LMOVM for company to call back with fax number.

## 2016-08-14 ENCOUNTER — Telehealth: Payer: Self-pay | Admitting: Family Medicine

## 2016-08-14 NOTE — Telephone Encounter (Signed)
FAXED

## 2016-08-14 NOTE — Telephone Encounter (Signed)
Rose requested fax number for the paperwork to be filled out for approval of using medication while on the job.  The fax number for this is [phone]

## 2017-09-30 IMAGING — DX DG ABDOMEN 2V
3 series · 3 of 3 positions shown · non-contrast
Comparison: None.

CLINICAL DATA: Left lower quadrant abdominal pain and tenderness
with nausea and vomiting.

EXAM:
ABDOMEN - 2 VIEW

[abdomen erect]
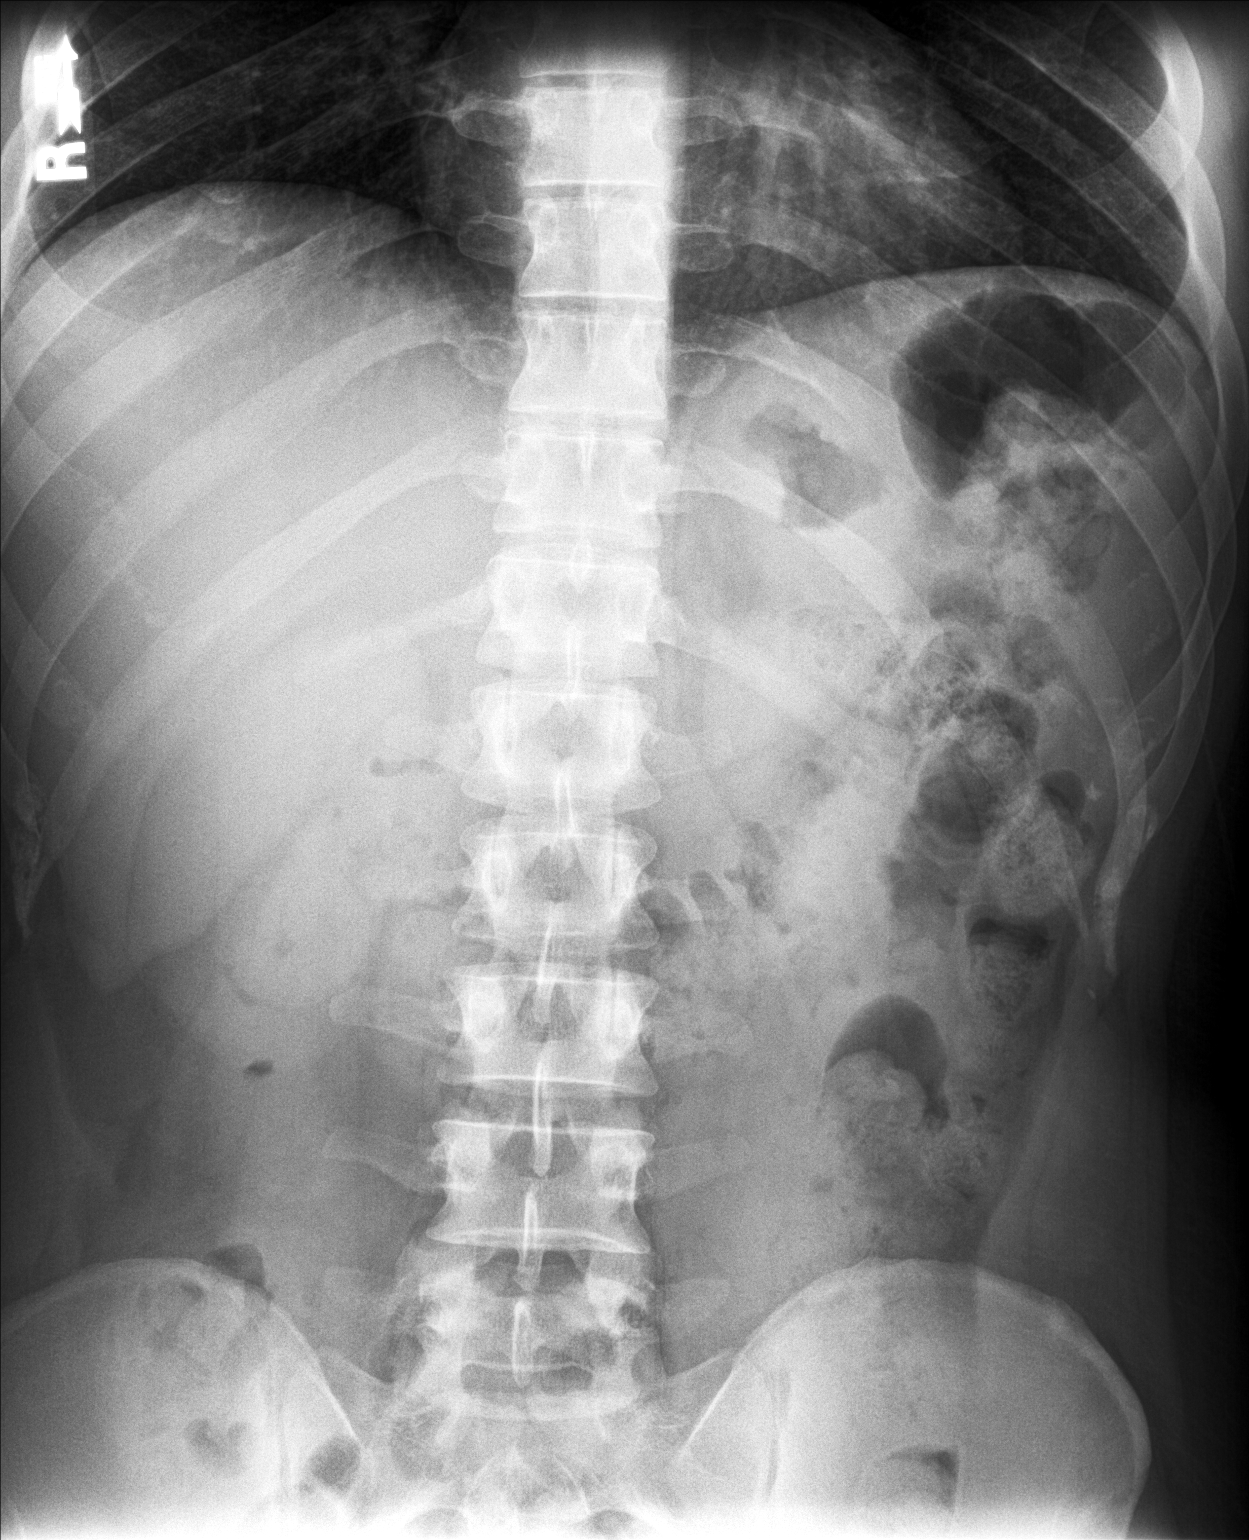

[abdomen supine (1 of 2)]
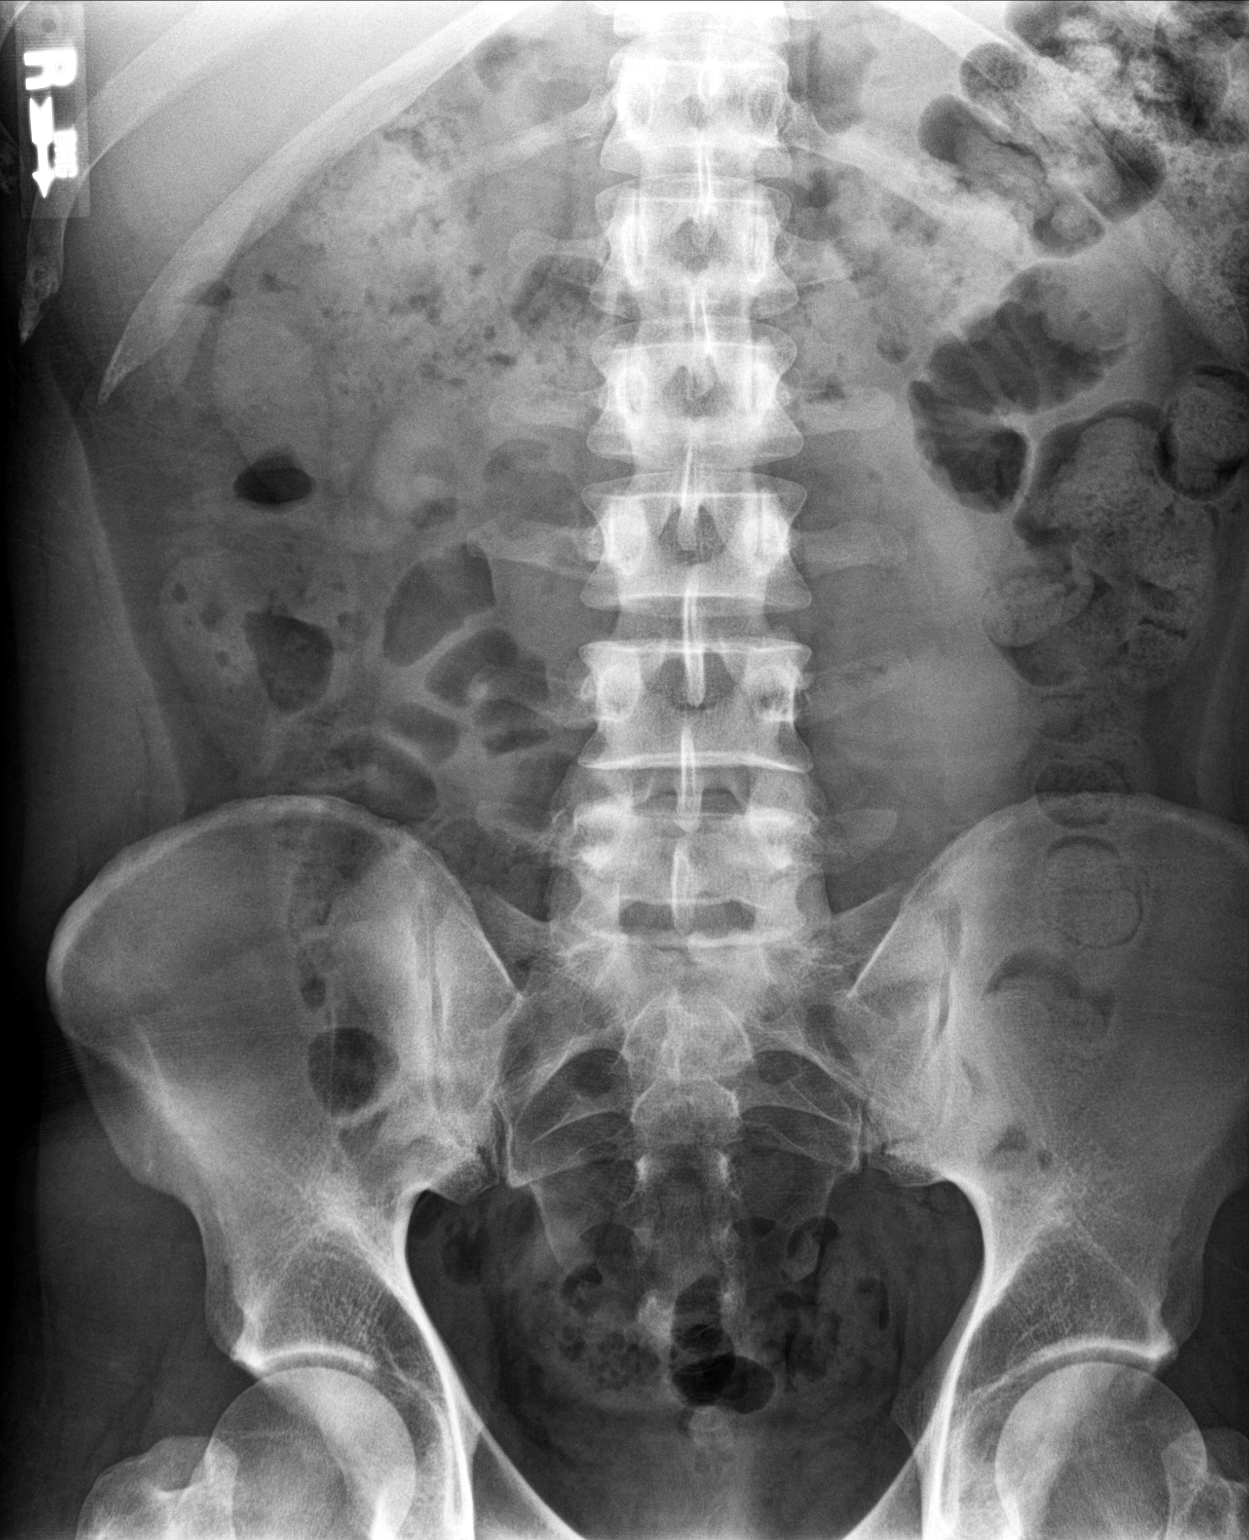

[abdomen supine (2 of 2)]
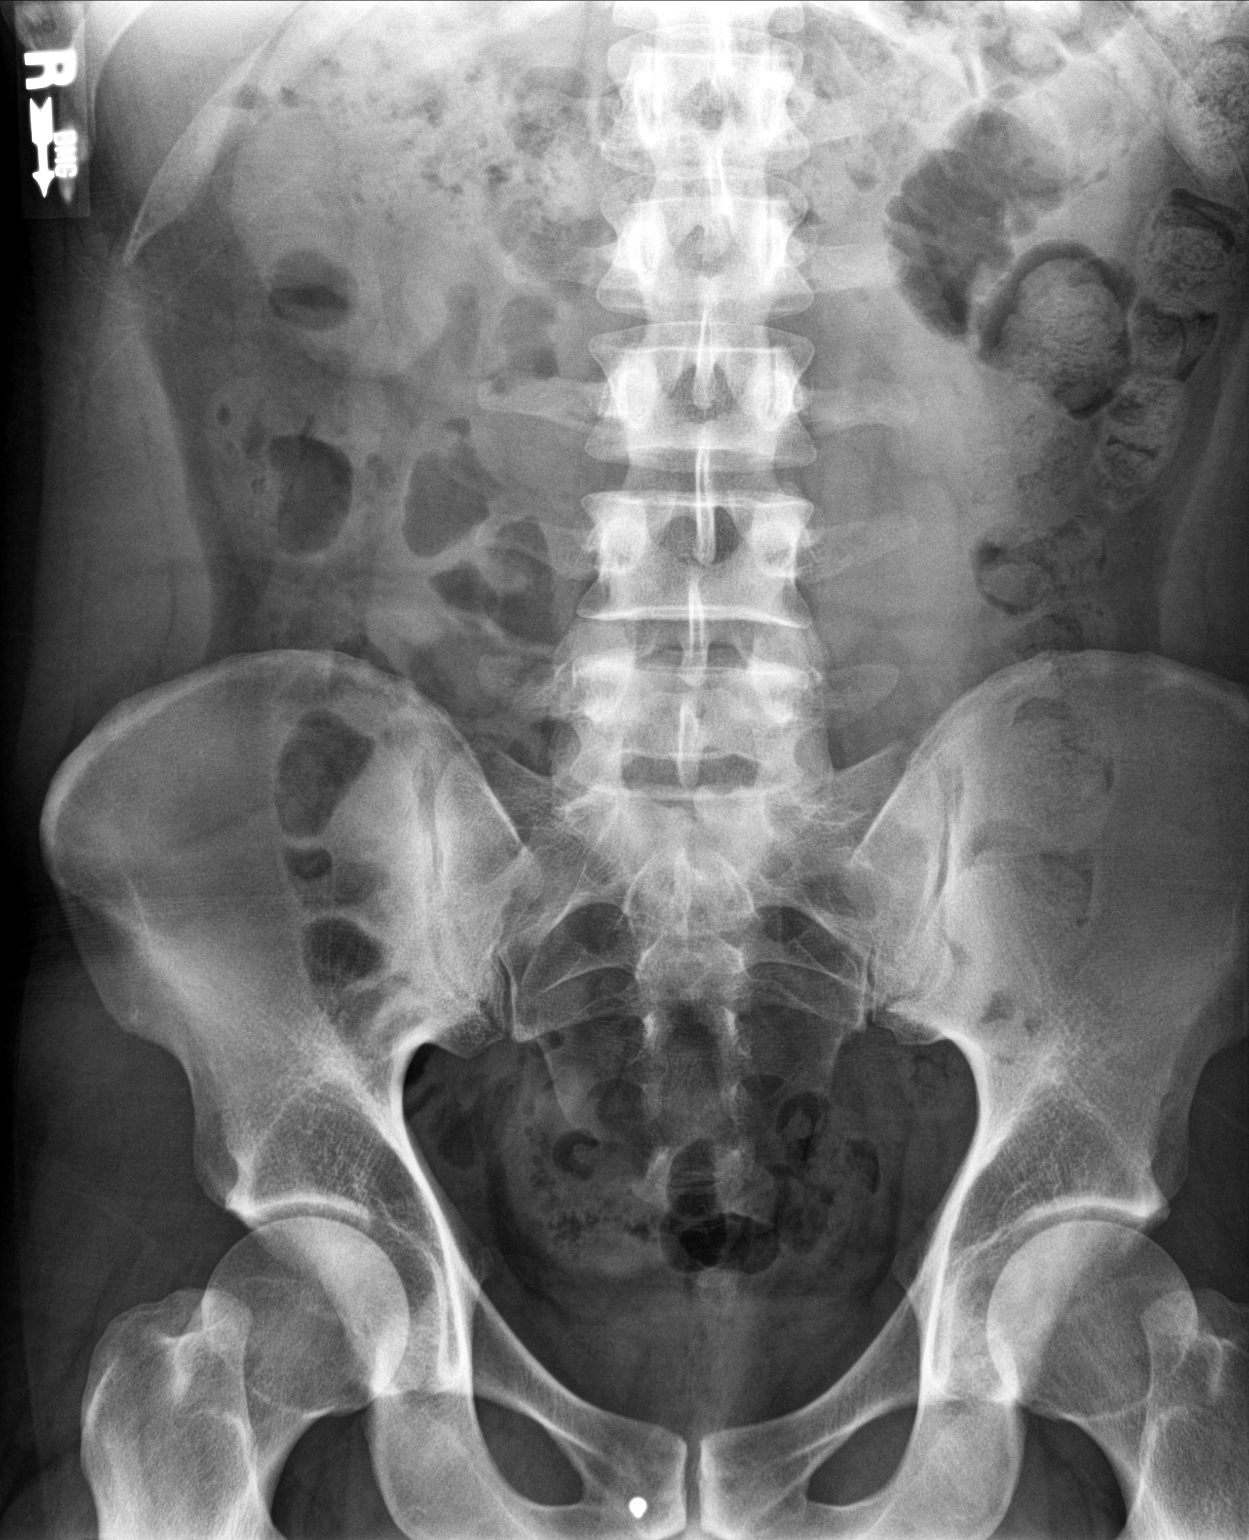

[3 of 3 positions shown; findings below may reference images not displayed]

FINDINGS: The bowel gas pattern is nonobstructive. Prominent stool burden
throughout the colon is noted. No free intraperitoneal air. No
abnormal abdominal calcification or bony abnormality.
IMPRESSION: No acute abnormality.

Prominent stool burden throughout the colon.

## 2017-09-30 IMAGING — CT CT ABD-PELV W/ CM
2 of 4 series · 15 of 46 positions shown, 17 images · IV contrast (Omni 300)
Comparison: None.

CLINICAL DATA: Severe abdominal pain

EXAM:
CT ABDOMEN AND PELVIS WITH CONTRAST
TECHNIQUE: Multidetector CT imaging of the abdomen and pelvis was performed
using the standard protocol following bolus administration of
intravenous contrast.
CONTRAST:  1 CRQFE2-RTT IOPAMIDOL (CRQFE2-RTT) INJECTION 61%

[Series 2: a/p w/ 5mm · axial · 0.97mm/px · z∈[+620,+1164]mm · 12 of 124 slices shown, 14 images]
[im 10/124  soft-tissue]
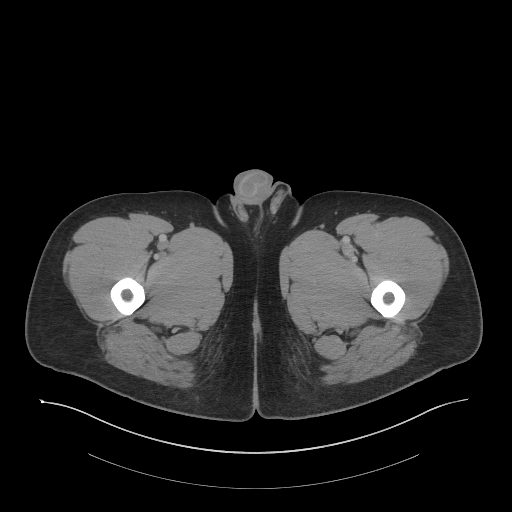
[im 10/124  bone]
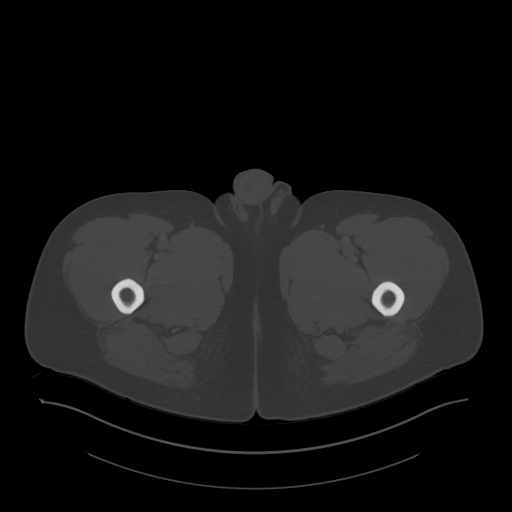
[im 20/124  soft-tissue]
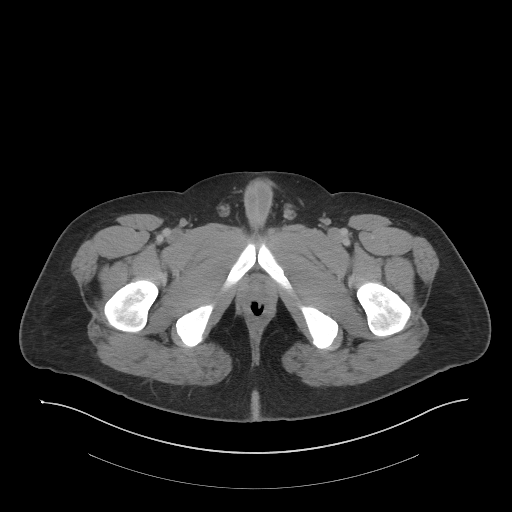
[im 30/124  soft-tissue]
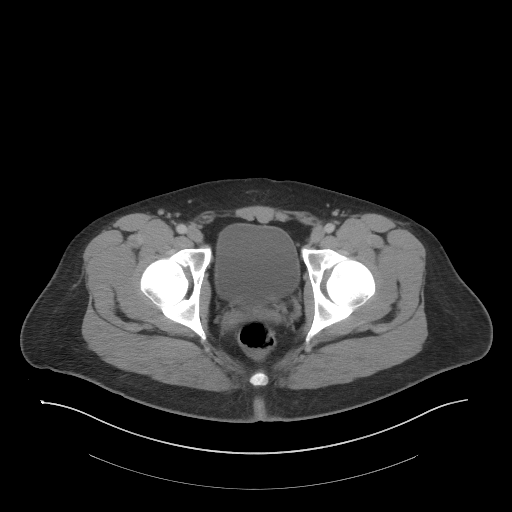
[im 40/124  soft-tissue]
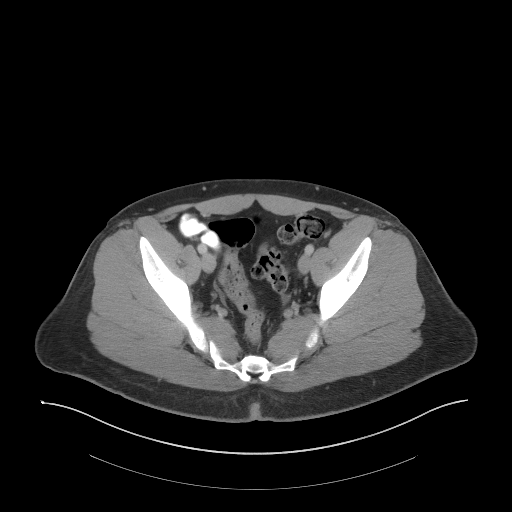
[im 50/124  soft-tissue]
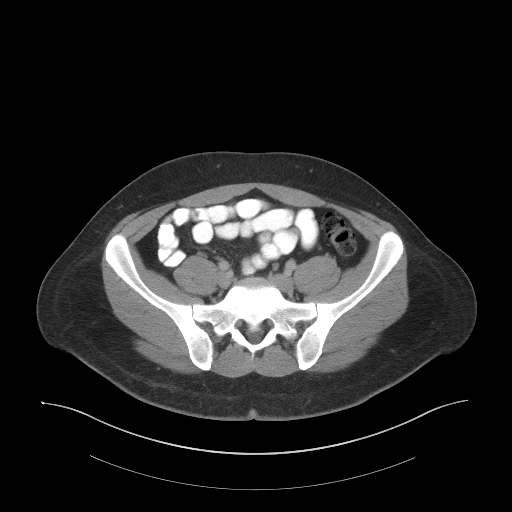
[im 60/124  soft-tissue]
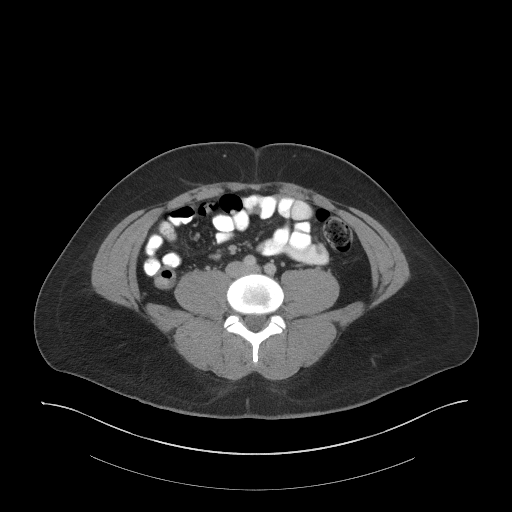
[im 69/124  soft-tissue]
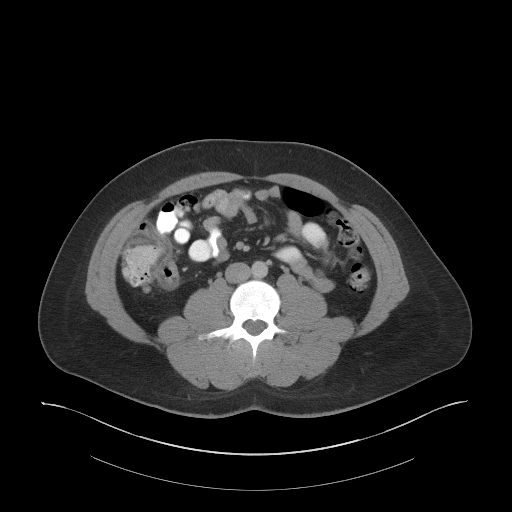
[im 79/124  soft-tissue]
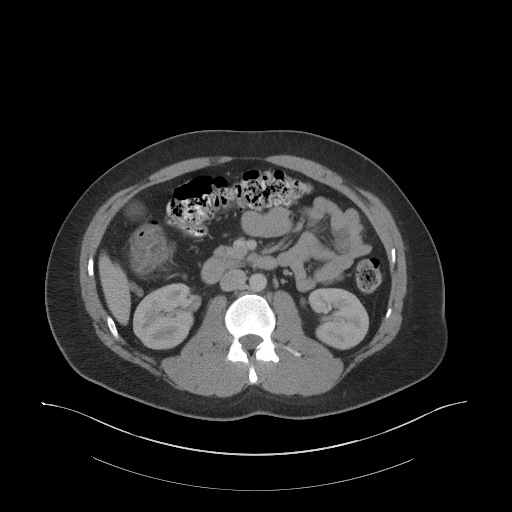
[im 89/124  soft-tissue]
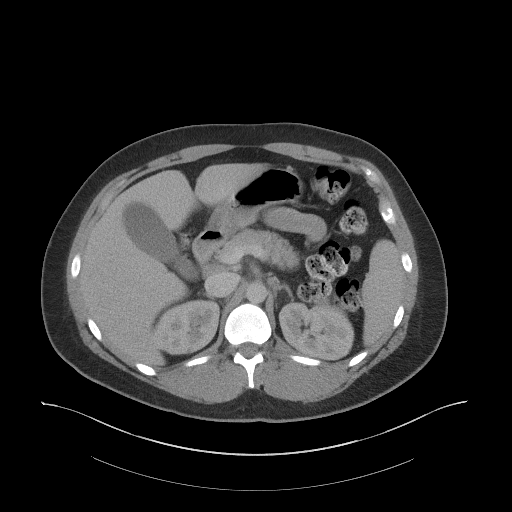
[im 89/124  bone]
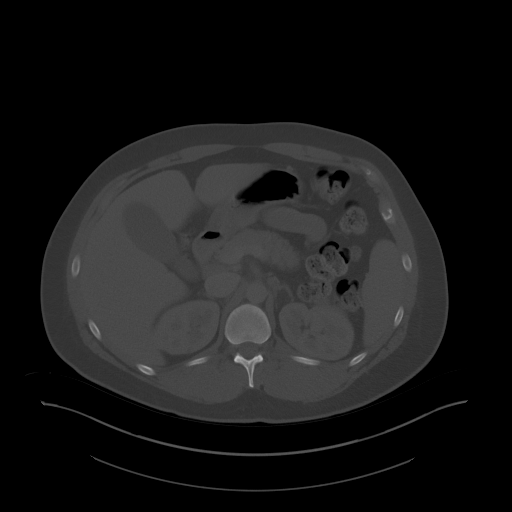
[im 99/124  soft-tissue]
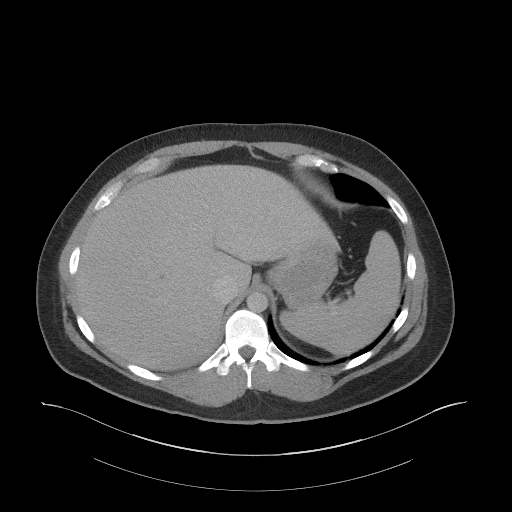
[im 109/124  soft-tissue]
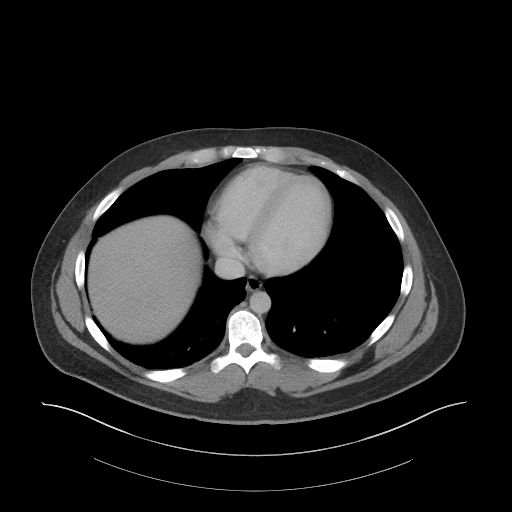
[im 119/124  soft-tissue]
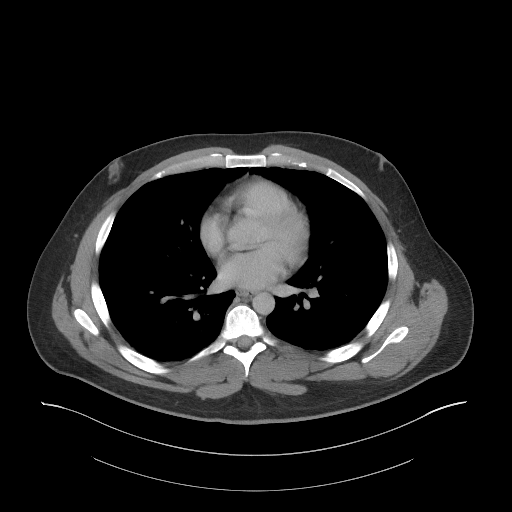

[Series 5: a/p w/ cor · coronal · 0.86mm/px · 3 of 136 slices shown]
[im 46/136  soft-tissue]
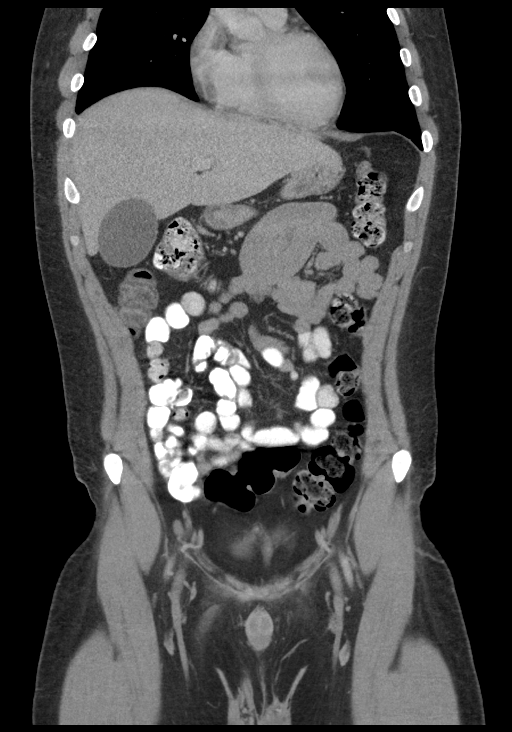
[im 61/136  soft-tissue]
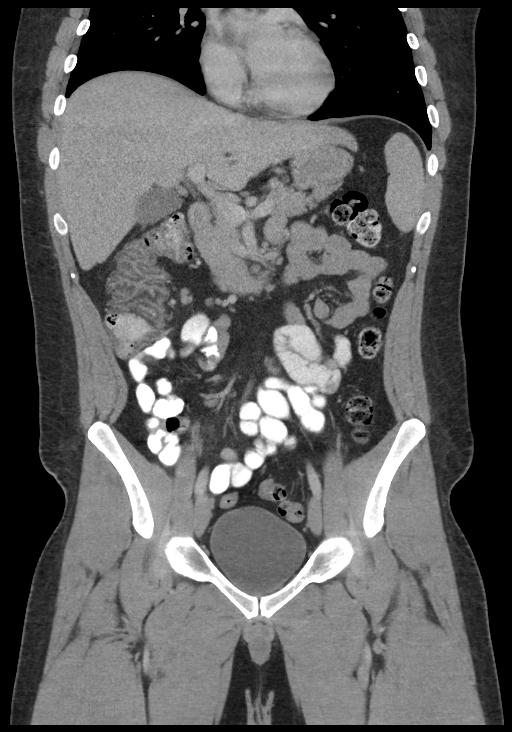
[im 76/136  soft-tissue]
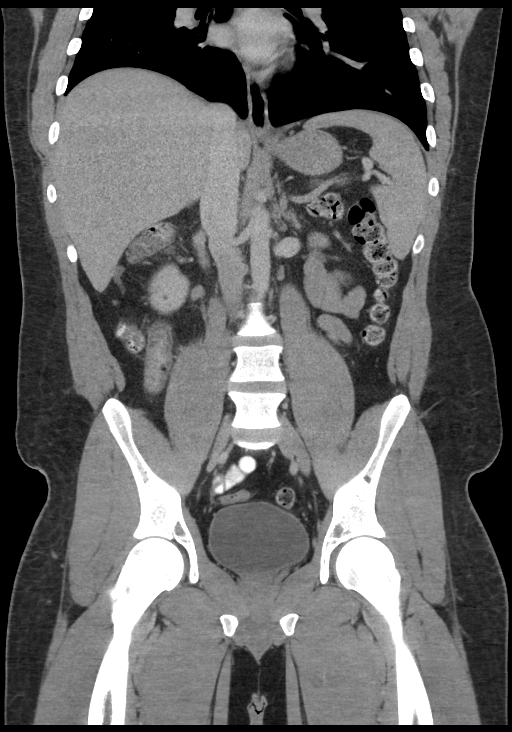

[15 of 46 positions shown; findings below may reference images not displayed]

FINDINGS: Lower chest: No acute abnormality.

Hepatobiliary: No focal liver abnormality is seen. No gallstones,
gallbladder wall thickening, or biliary dilatation.

Pancreas: Unremarkable. No pancreatic ductal dilatation or
surrounding inflammatory changes.

Spleen: Normal in size without focal abnormality.

Adrenals/Urinary Tract: Adrenal glands are unremarkable. Kidneys are
normal, without renal calculi, focal lesion, or hydronephrosis.
Bladder is unremarkable.

Stomach/Bowel: The stomach is normal. There is no pathologic
dilatation of the small or large bowel loops. Wall thickening
involving the terminal ileum is identified which measures up to 8
mm. There is also abnormal wall thickening involving the ascending
colon to the level of the hepatic flexure. The appendix is
visualized and appears normal.

Vascular/Lymphatic: No significant vascular findings are present. No
enlarged abdominal or pelvic lymph nodes.

Reproductive: Prostate is unremarkable.

Other: Trace free fluid is identified within the dependent portion
of the pelvis.

Musculoskeletal: No acute or significant osseous findings.
IMPRESSION: 1. Examination is positive for abnormal wall edema involving the
terminal ileum and ascending colon. Findings are compatible with
inflammatory or infectious enteritis/colitis. Inflammatory bowel
disease not excluded. There is no evidence for bowel perforation,
obstruction or abscess.

## 2017-10-05 ENCOUNTER — Encounter: Payer: Self-pay | Admitting: Family Medicine

## 2017-10-10 ENCOUNTER — Encounter: Payer: Self-pay | Admitting: Family Medicine
